# Patient Record
Sex: Male | Born: 1970 | Race: White | Hispanic: No | Marital: Married
Health system: Southern US, Community
[De-identification: ages and names within clinical notes are randomized; demographics above are authoritative.]

---

## 2006-01-23 ENCOUNTER — Emergency Department: Payer: Self-pay | Admitting: Emergency Medicine

## 2006-04-05 ENCOUNTER — Emergency Department: Payer: Self-pay | Admitting: Unknown Physician Specialty

## 2006-04-10 ENCOUNTER — Emergency Department: Payer: Self-pay | Admitting: Emergency Medicine

## 2008-08-11 ENCOUNTER — Emergency Department: Payer: Self-pay | Admitting: Emergency Medicine

## 2008-10-28 ENCOUNTER — Emergency Department: Payer: Self-pay | Admitting: Emergency Medicine

## 2009-10-04 IMAGING — CT CT STONE STUDY
1 of 2 series · 15 of 32 positions shown, 19 images · non-contrast
Comparison: none

REASON FOR EXAM: severe r sided abd pain hematuria
COMMENTS:

PROCEDURE:     CT  - CT ABDOMEN /PELVIS WO (STONE)  - August 11, 2008 [DATE]
RESULT:
HISTORY: Right flank pain.

[Series 2: stone · axial · 0.75mm/px · z∈[-236,+154]mm · 15 of 147 slices shown, 19 images]
[im 11/147  soft-tissue]
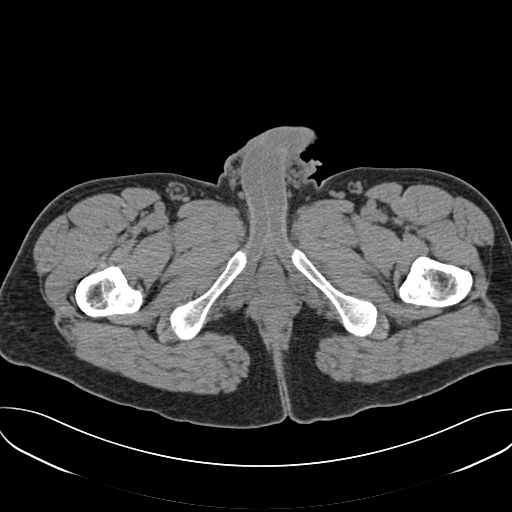
[im 11/147  bone]
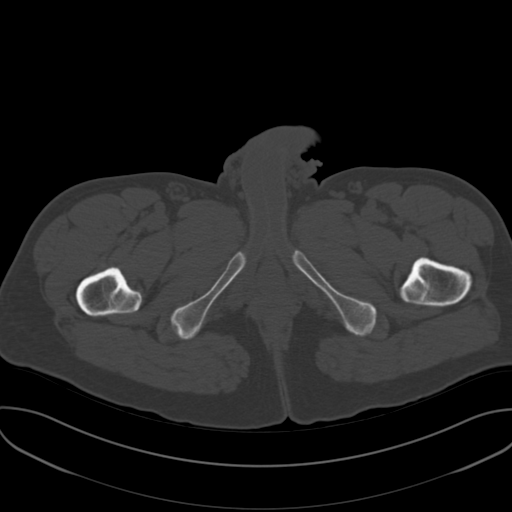
[im 22/147  soft-tissue]
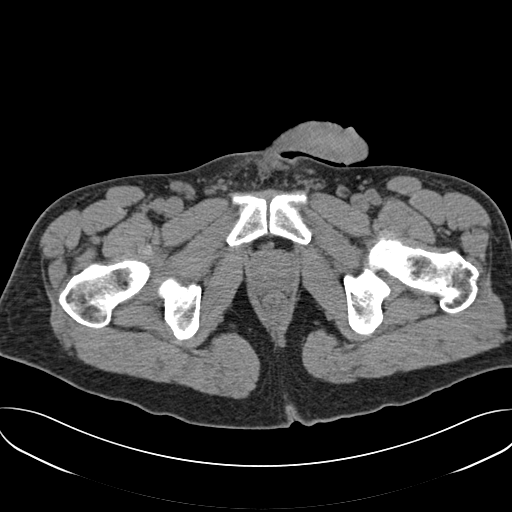
[im 33/147  soft-tissue]
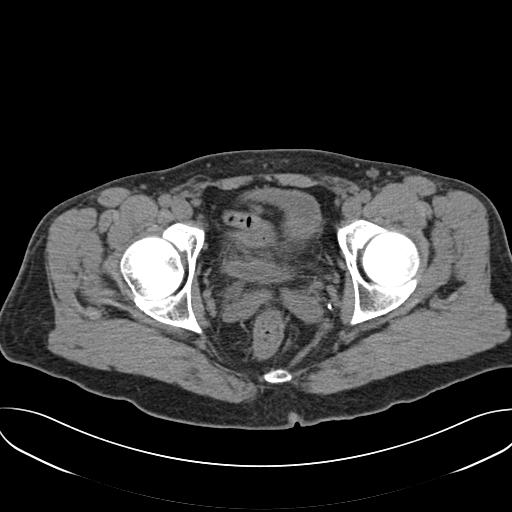
[im 44/147  soft-tissue]
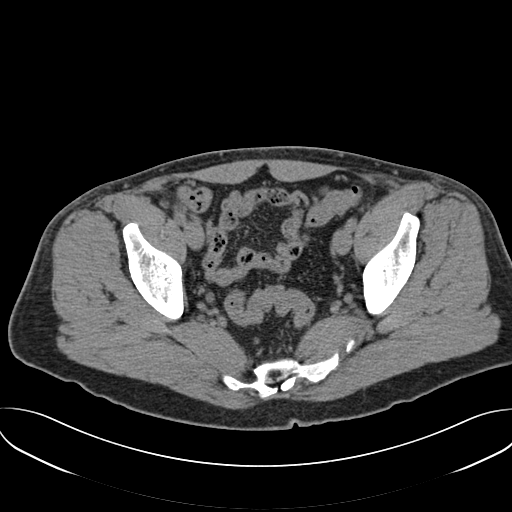
[im 55/147  soft-tissue]
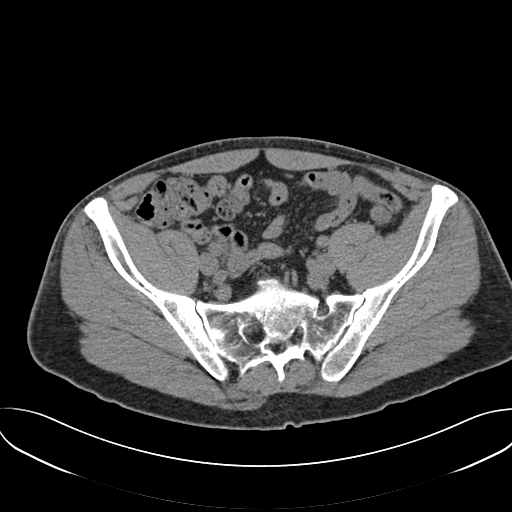
[im 65/147  soft-tissue]
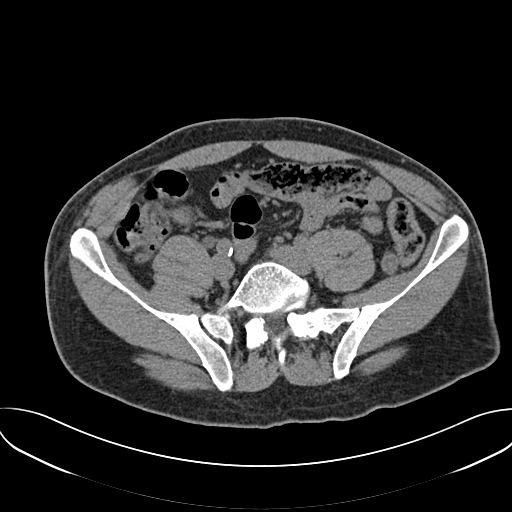
[im 76/147  soft-tissue]
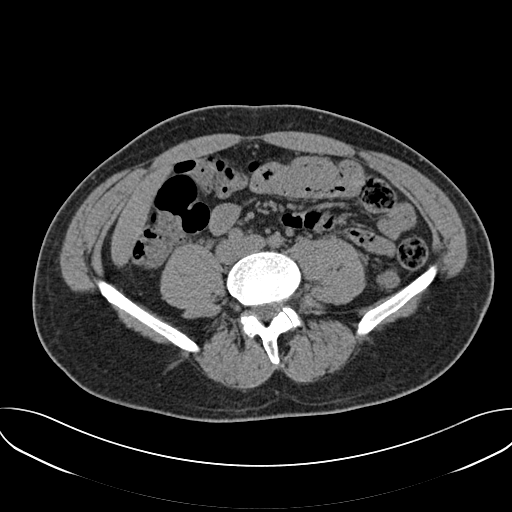
[im 87/147  soft-tissue]
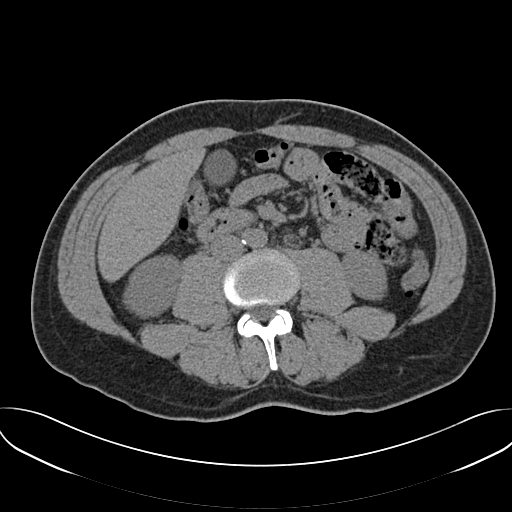
[im 98/147  soft-tissue]
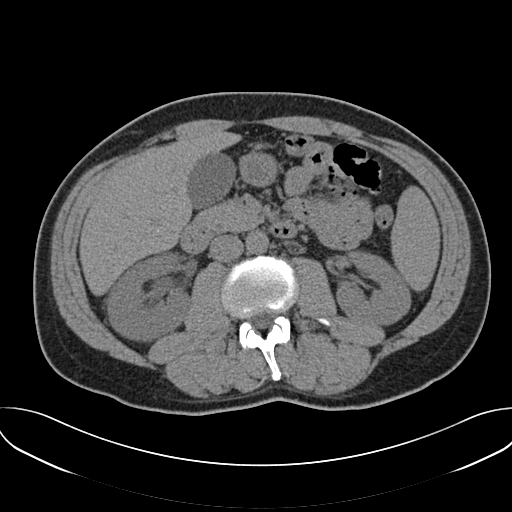
[im 98/147  bone]
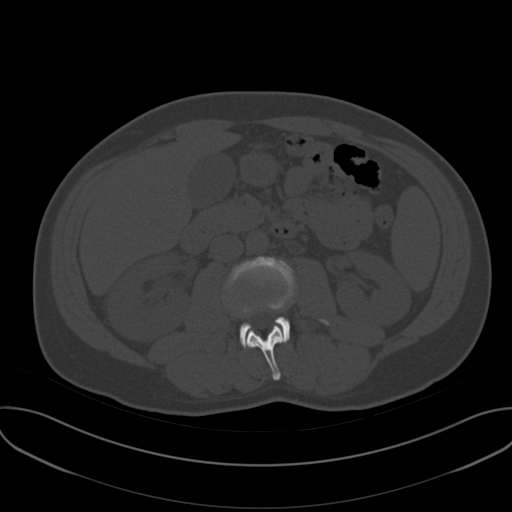
[im 109/147  soft-tissue]
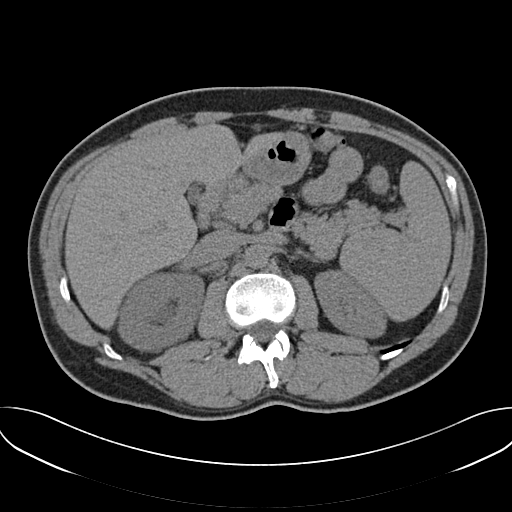
[im 119/147  soft-tissue]
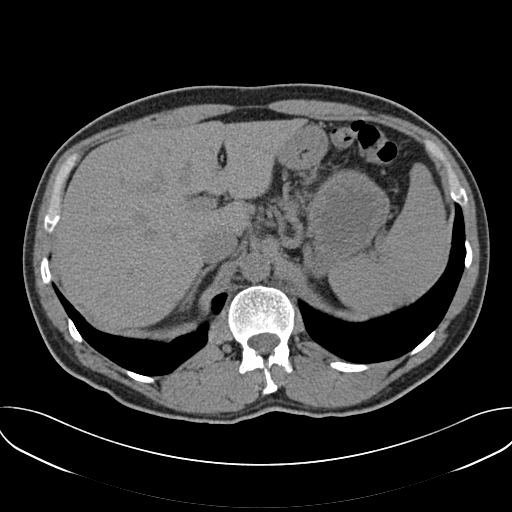
[im 125/147  lung]
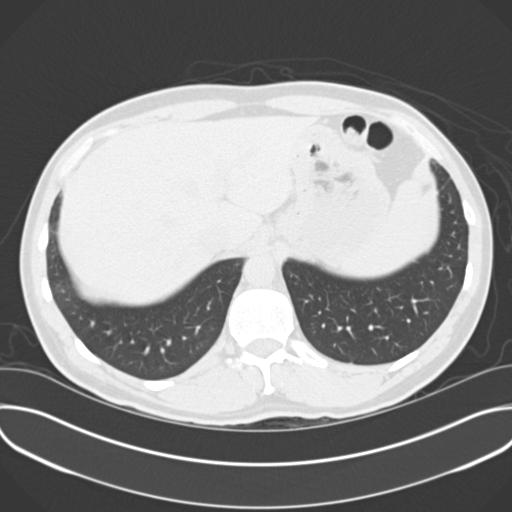
[im 130/147  soft-tissue]
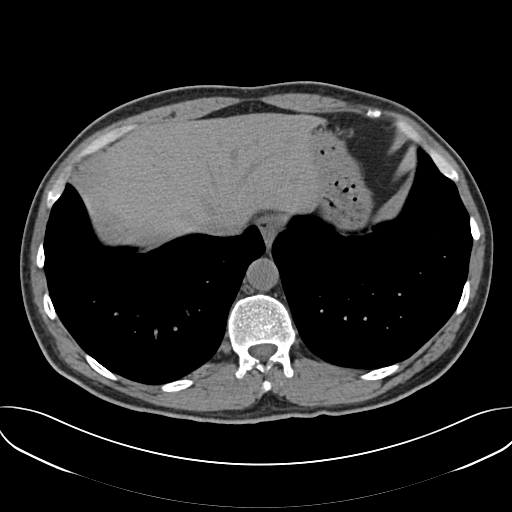
[im 130/147  lung]
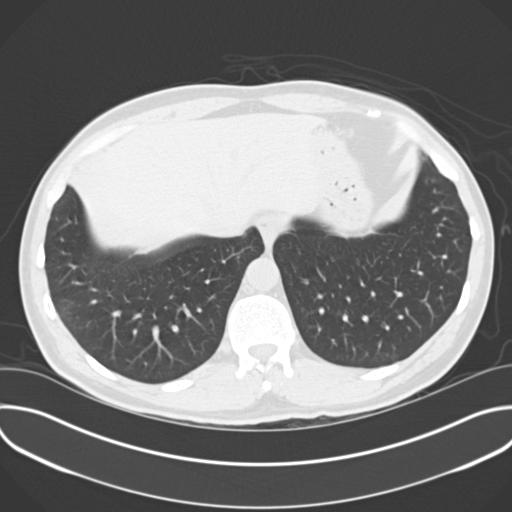
[im 136/147  lung]
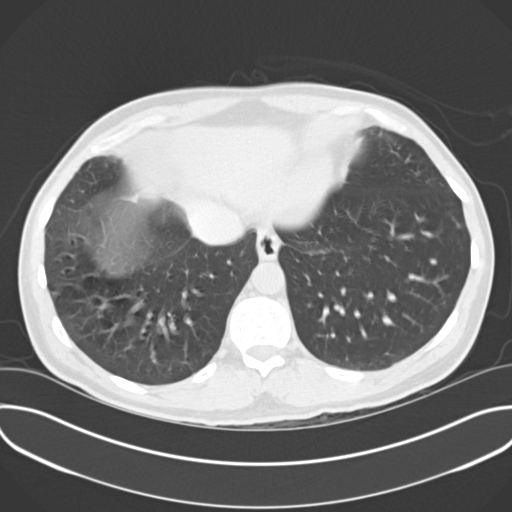
[im 141/147  soft-tissue]
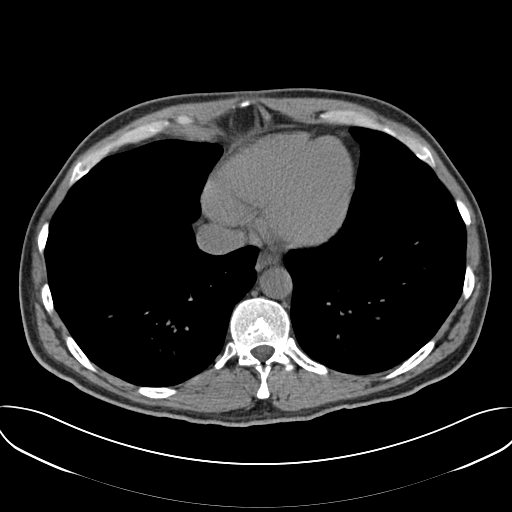
[im 141/147  lung]
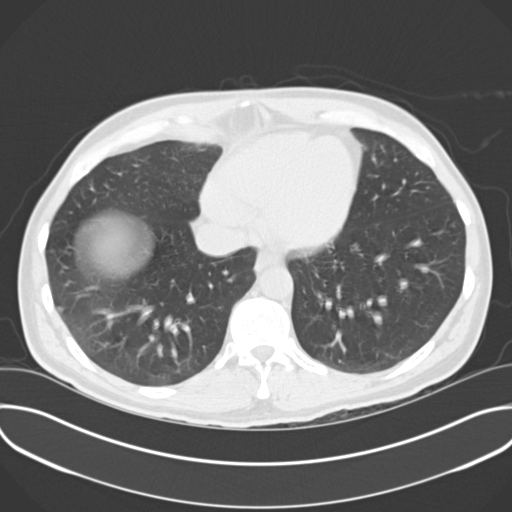

[15 of 32 positions shown; findings below may reference images not displayed]

PROCEDURE AND FINDNIGS:  Nonenhanced CT of abdomen and pelvis is obtained.

The liver and spleen are normal. The pancreas is unremarkable. The adrenals
are normal. Nephrolithiasis is present on the left. There is right
hydronephrosis and hydroureter to the level of the ureterovesical junction
at which point there is a tiny, sand-like stone most likely passing into the
bladder. A small lucency is noted in the right kidney. This is most likely a
simple cyst as Hounsfield units of 7 are noted.

The bladder is nondistended. There is mild thickening of the bladder wall.
No inguinal adenopathy is noted.
IMPRESSION: 1.     Tiny, sand-like stone in the distal ureter with hydronephrosis and
hydroureter.
2.     Left nephrolithiasis.
3.     Incidental note is made of sigmoid colonic diverticulosis.
4.     The appendix is not well visualized.

## 2013-02-19 ENCOUNTER — Emergency Department: Payer: Self-pay | Admitting: Emergency Medicine

## 2014-04-14 IMAGING — CR CERVICAL SPINE - COMPLETE 4+ VIEW
1 series · 9 of 10 positions shown · non-contrast
Comparison: none

REASON FOR EXAM: pain
COMMENTS:   May transport without cardiac monitor

PROCEDURE:     DXR - DXR CERVICAL SPINE COMPLETE  - February 19, 2013  [DATE]
RESULT:     Degenerative changes cervical spine is present. Congenital
fusion of a lower cervical spine noted. There is no fracture or dislocation.

[Series 1: lat · 0.17mm/px · 9 of 10 slices shown]
[im 1/10]
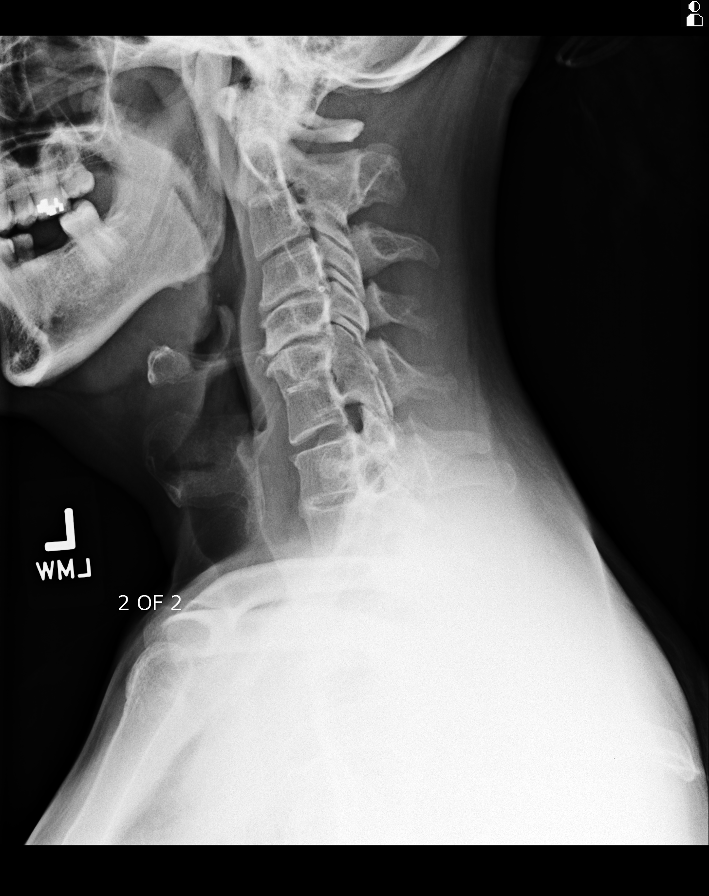
[im 2/10]
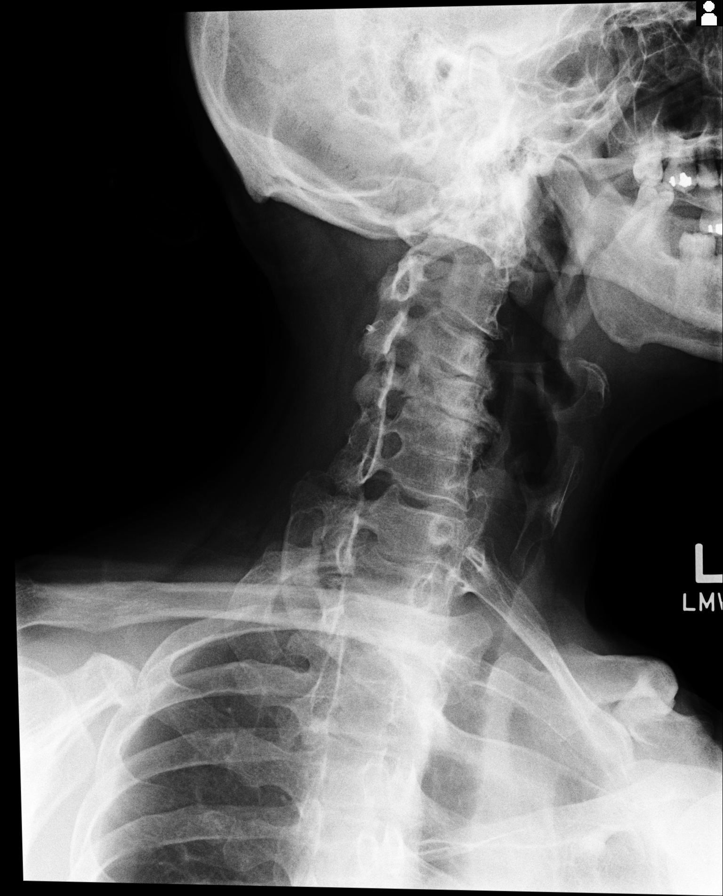
[im 3/10]
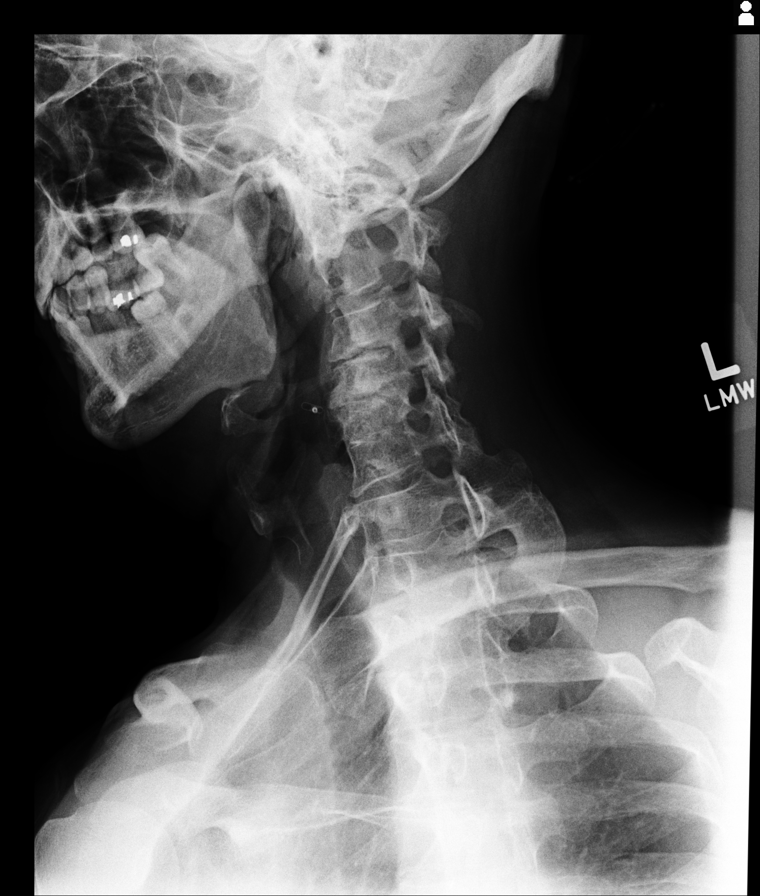
[im 4/10]
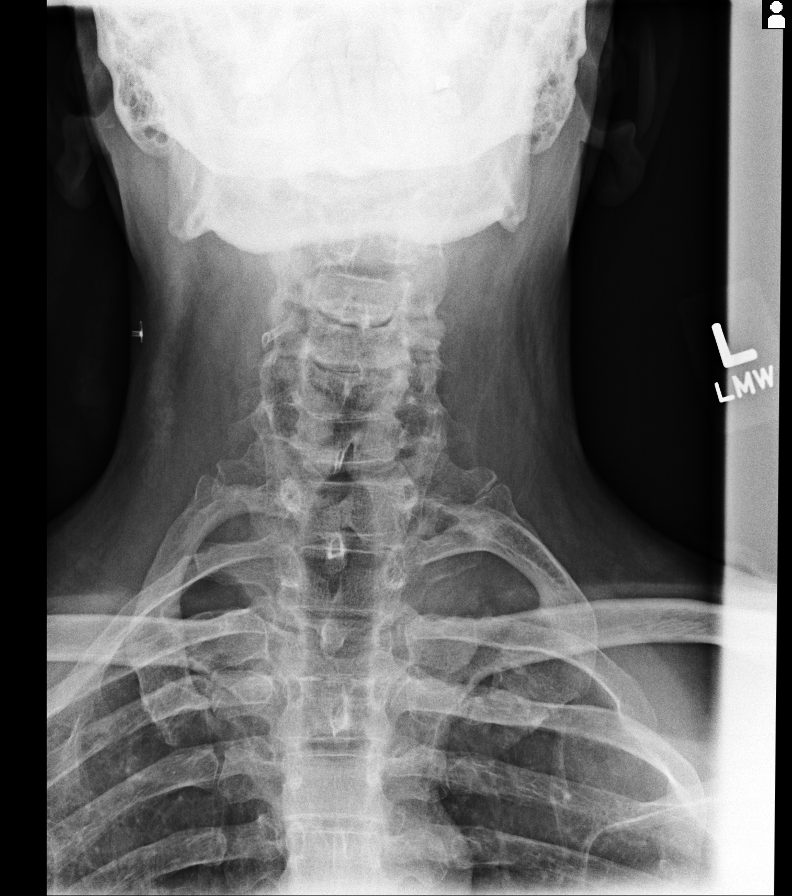
[im 5/10]
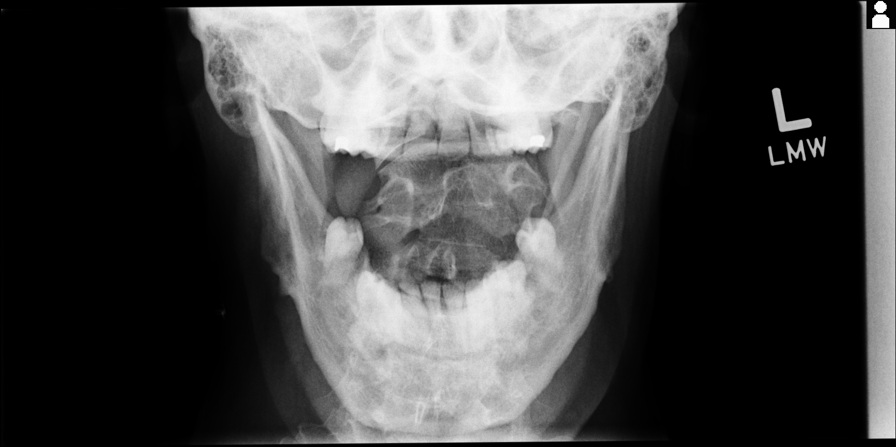
[im 6/10]
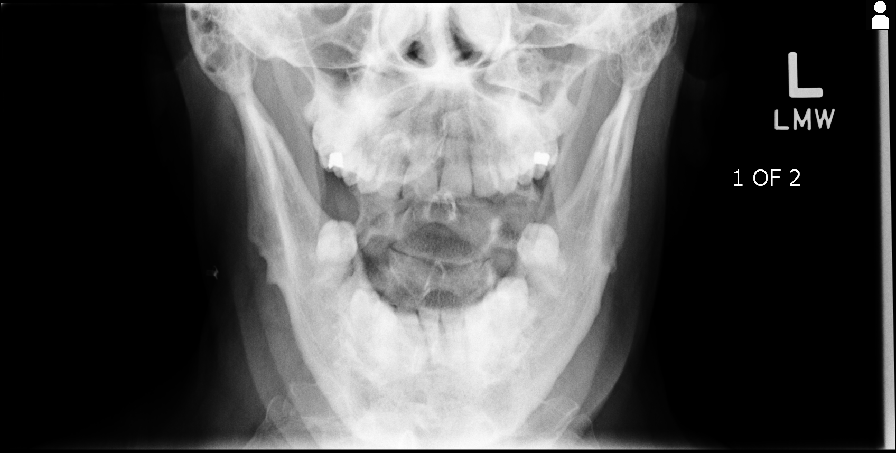
[im 7/10]
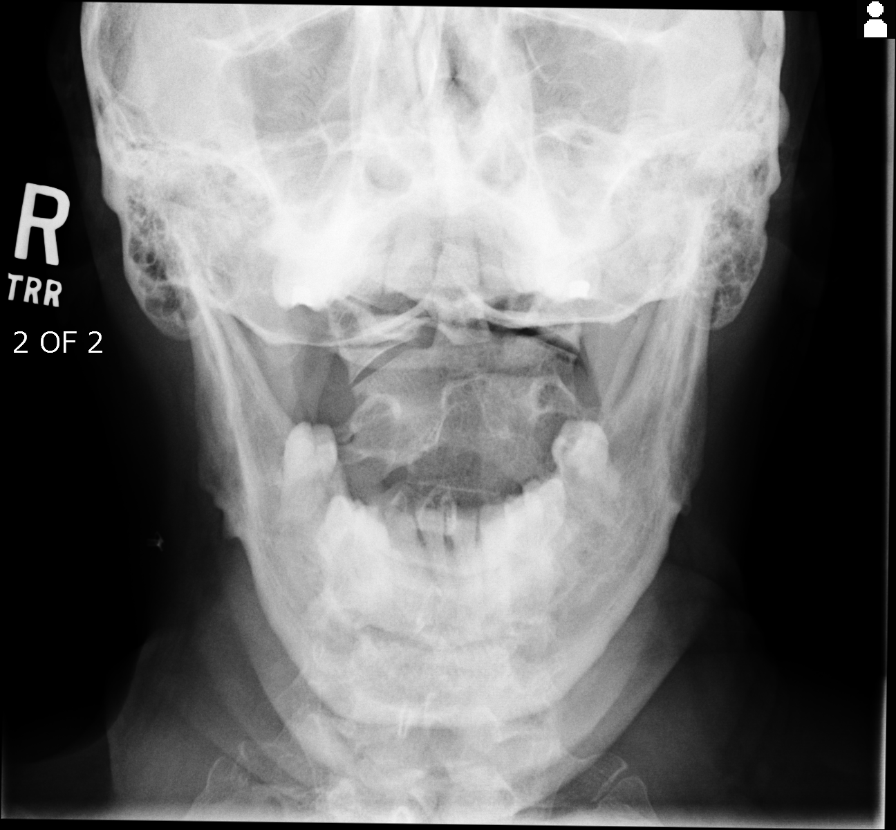
[im 8/10]
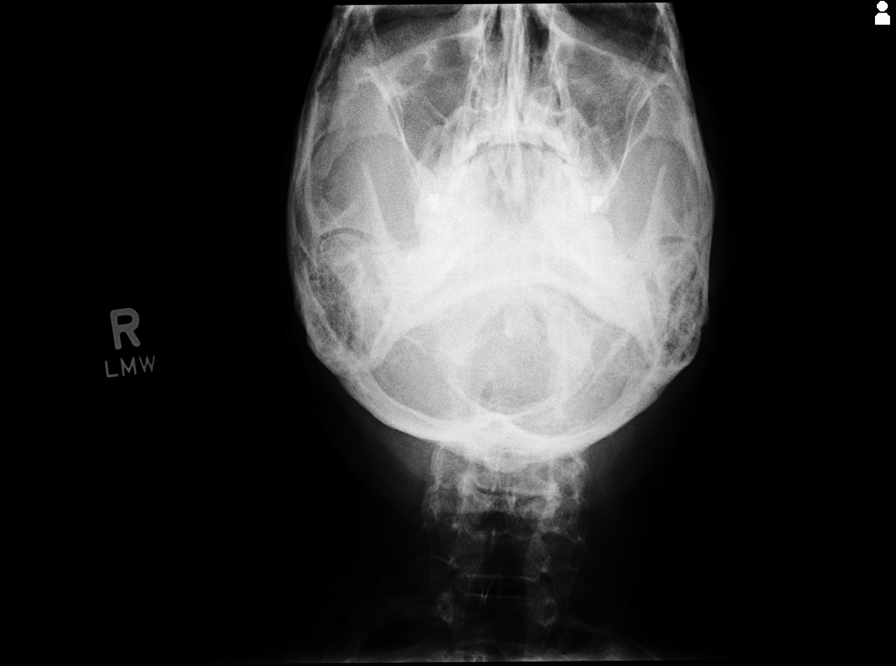
[im 9/10]
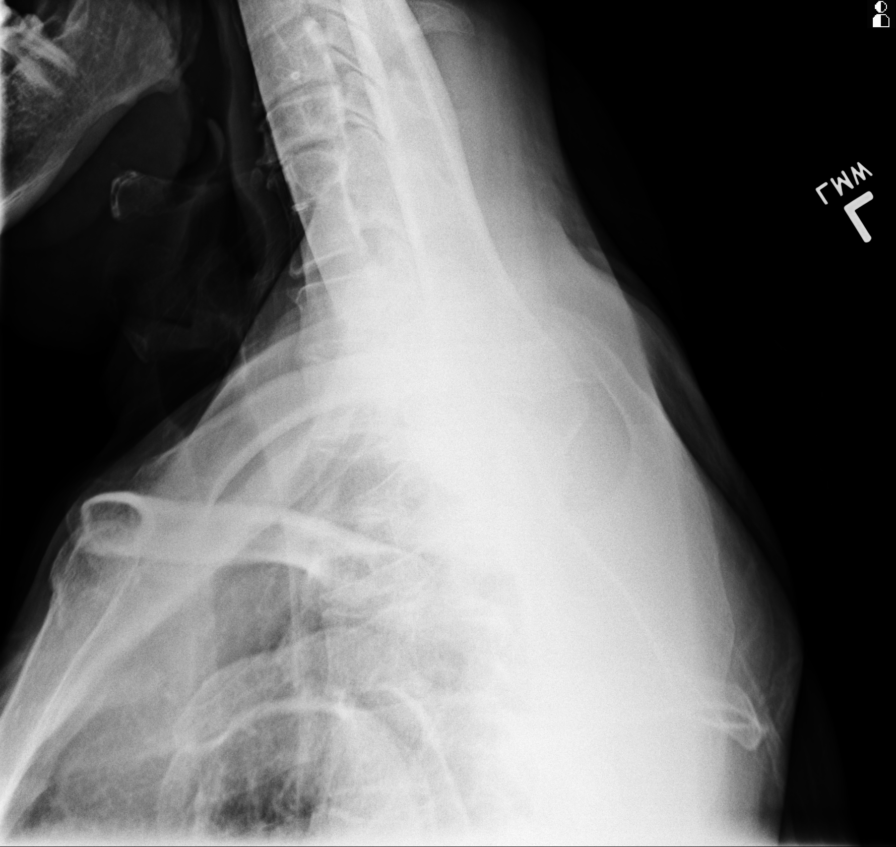

[9 of 10 positions shown; findings below may reference images not displayed]

IMPRESSION: Degenerative change cervical spine. Congenital fusion of
the a lower cervical spine noted. No fracture or dislocation.

## 2021-08-10 ENCOUNTER — Emergency Department
Admission: EM | Admit: 2021-08-10 | Discharge: 2021-08-10 | Disposition: A | Discharge status: Home / Self Care | Payer: Self-pay | Attending: Emergency Medicine | Admitting: Emergency Medicine

## 2021-08-10 ENCOUNTER — Emergency Department: Payer: Self-pay

## 2021-08-10 ENCOUNTER — Other Ambulatory Visit: Payer: Self-pay

## 2021-08-10 ENCOUNTER — Encounter: Payer: Self-pay | Admitting: Emergency Medicine

## 2021-08-10 DIAGNOSIS — S63502A Unspecified sprain of left wrist, initial encounter: Secondary | ICD-10-CM

## 2021-08-10 DIAGNOSIS — W19XXXA Unspecified fall, initial encounter: Secondary | ICD-10-CM | POA: Insufficient documentation

## 2021-08-10 DIAGNOSIS — Y92009 Unspecified place in unspecified non-institutional (private) residence as the place of occurrence of the external cause: Secondary | ICD-10-CM

## 2021-08-10 DIAGNOSIS — S6392XA Sprain of unspecified part of left wrist and hand, initial encounter: Secondary | ICD-10-CM | POA: Insufficient documentation

## 2021-08-10 MED ORDER — IBUPROFEN 800 MG PO TABS: 800.0000 mg | ORAL_TABLET | Freq: Three times a day (TID) | ORAL | 0 refills | Status: DC | PRN

## 2021-08-10 MED ORDER — HYDROCODONE-ACETAMINOPHEN 5-325 MG PO TABS: 1.0000 | ORAL_TABLET | Freq: Three times a day (TID) | ORAL | 0 refills | Status: DC | PRN

## 2021-08-10 MED ORDER — HYDROCODONE-ACETAMINOPHEN 5-325 MG PO TABS: 1.0000 | ORAL_TABLET | Freq: Three times a day (TID) | ORAL | 0 refills | Status: AC | PRN

## 2021-08-10 MED ORDER — IBUPROFEN 800 MG PO TABS: 800.0000 mg | ORAL_TABLET | Freq: Three times a day (TID) | ORAL | 0 refills | Status: AC | PRN

## 2021-08-10 NOTE — ED Provider Notes (Signed)
[facility] Emergency Department Provider Note     Event Date/Time   First MD Initiated Contact with Patient 08/10/21 1836     (approximate)   History   Fall and Wrist Pain   HPI  Jonathan Roberts is a 51 y.o. male presents to the ED for evaluation of left wrist pain. He reports a mechanical fall yesterday, onto an outstretched hand. He denies head injury, LOC, or lacerations.        Physical Exam   Triage Vital Signs: ED Triage Vitals  Enc Vitals Group     BP 08/10/21 1649 (!) 146/73     Pulse Rate 08/10/21 1649 72     Resp 08/10/21 1649 18     Temp 08/10/21 1649 97.8 F (36.6 C)     Temp Source 08/10/21 1649 Oral     SpO2 08/10/21 1649 100 %     Weight 08/10/21 1643 180 lb (81.6 kg)     Height 08/10/21 1643 5\' 11"  (1.803 m)     Head Circumference --      Peak Flow --      Pain Score 08/10/21 1643 10     Pain Loc --      Pain Edu? --      Excl. in GC? --     Most recent vital signs: Vitals:   08/10/21 1649  BP: (!) 146/73  Pulse: 72  Resp: 18  Temp: 97.8 F (36.6 C)  SpO2: 100%    General Awake, no distress.  CV:  Good peripheral perfusion.  RESP:  Normal effort.  MSK:  Left wrist without obvious deformity, dislocation, or effusion. STS noted dorsally. Normal composite fist.    ED Results / Procedures / Treatments   Labs (all labs ordered are listed, but only abnormal results are displayed) Labs Reviewed - No data to display   EKG   RADIOLOGY  I personally viewed and evaluated these images as part of my medical decision making, as well as reviewing the written report by the radiologist.  ED Provider Interpretation: no acute finding}  DG Wrist Complete Left  Result Date: 08/10/2021 CLINICAL DATA:  Pain post fall EXAM: LEFT WRIST - COMPLETE 3+ VIEW COMPARISON:  None FINDINGS: Osseous mineralization normal. Joint spaces preserved. No acute fracture, dislocation, or bone destruction. Dorsal soft tissue swelling at  carpus. IMPRESSION: Dorsal soft tissue swelling at carpus. No acute osseous abnormalities. Electronically Signed   By: Ulyses Southward M.D.   On: 08/10/2021 17:07     PROCEDURES:  Critical Care performed: No  Procedures   MEDICATIONS ORDERED IN ED: Medications - No data to display   IMPRESSION / MDM / ASSESSMENT AND PLAN / ED COURSE  I reviewed the triage vital signs and the nursing notes.                              Differential diagnosis includes, but is not limited to, wrist sprain, wrist fracture, hand contusion  Patient to the ED for evaluation of injuries sustained following a mechanical fall, yesterday. He is evaluated for his FOOSH. Plain films of the left wrist are negative for acute fracture or dislocation, based on my review of films. Patient's diagnosis is consistent with wrist sprain. Patient will be discharged home with prescriptions for IBU and Norco. He is placed in a cock-up splint. Patient is to follow up with [facility] as needed or otherwise directed. Patient is  given ED precautions to return to the ED for any worsening or new symptoms.   FINAL CLINICAL IMPRESSION(S) / ED DIAGNOSES   Final diagnoses:  Fall in home, initial encounter  Sprain of left wrist, initial encounter     Rx / DC Orders   ED Discharge Orders          Ordered    ibuprofen (ADVIL) 800 MG tablet  Every 8 hours PRN        08/10/21 1900    HYDROcodone-acetaminophen (NORCO) 5-325 MG tablet  3 times daily PRN        08/10/21 1900             Note:  This document was prepared using Dragon voice recognition software and may include unintentional dictation errors.    Melvenia Needles, PA-C 08/10/21 1920    Nance Pear, MD 08/10/21 2104

## 2021-08-10 NOTE — ED Triage Notes (Signed)
Pt reports fell out of his storage unit last pm and landed on his left hand hurting his left wrist. Pt reports pain to left wrist and swelling.

## 2021-08-10 NOTE — Discharge Instructions (Addendum)
Your exam and XR are negative for fracture. You have evidence of a wrist sprain. Wear the brace as needed for support. Apply ice to reduce pain and swelling.
